# Patient Record
Sex: Male | Born: 1985 | Race: Black or African American | Hispanic: No | Marital: Single | Smoking: Never smoker
Health system: Southern US, Community
[De-identification: ages and names within clinical notes are randomized; demographics above are authoritative.]

---

## 2019-06-01 ENCOUNTER — Other Ambulatory Visit: Payer: Self-pay

## 2019-06-01 ENCOUNTER — Ambulatory Visit (HOSPITAL_COMMUNITY)
Admission: AD | Admit: 2019-06-01 | Discharge: 2019-06-01 | Disposition: A | Discharge status: Home / Self Care | Payer: Medicaid - Out of State | Source: Other Acute Inpatient Hospital | Attending: Emergency Medicine | Admitting: Emergency Medicine

## 2019-06-01 ENCOUNTER — Emergency Department: Payer: Self-pay

## 2019-06-01 ENCOUNTER — Emergency Department
Admission: EM | Admit: 2019-06-01 | Discharge: 2019-06-01 | Disposition: A | Discharge status: Other Acute Inpatient Hospital | Payer: Self-pay | Attending: Emergency Medicine | Admitting: Emergency Medicine

## 2019-06-01 DIAGNOSIS — I7103 Dissection of thoracoabdominal aorta: Secondary | ICD-10-CM | POA: Insufficient documentation

## 2019-06-01 DIAGNOSIS — Z20822 Contact with and (suspected) exposure to covid-19: Secondary | ICD-10-CM | POA: Insufficient documentation

## 2019-06-01 DIAGNOSIS — I10 Essential (primary) hypertension: Secondary | ICD-10-CM | POA: Insufficient documentation

## 2019-06-01 DIAGNOSIS — I712 Thoracic aortic aneurysm, without rupture: Secondary | ICD-10-CM | POA: Insufficient documentation

## 2019-06-01 DIAGNOSIS — R079 Chest pain, unspecified: Secondary | ICD-10-CM

## 2019-06-01 LAB — COMPREHENSIVE METABOLIC PANEL
ALT: 23 U/L (ref 0–44)
AST: 28 U/L (ref 15–41)
Albumin: 4 g/dL (ref 3.5–5.0)
Alkaline Phosphatase: 73 U/L (ref 38–126)
Anion gap: 11 (ref 5–15)
BUN: 21 mg/dL — ABNORMAL HIGH (ref 6–20)
CO2: 26 mmol/L (ref 22–32)
Calcium: 9 mg/dL (ref 8.9–10.3)
Chloride: 102 mmol/L (ref 98–111)
Creatinine, Ser: 1.22 mg/dL (ref 0.61–1.24)
GFR calc Af Amer: >60 mL/min (ref >60)
GFR calc non Af Amer: >60 mL/min (ref >60)
Glucose, Bld: 130 mg/dL — ABNORMAL HIGH (ref 70–99)
Potassium: 3.5 mmol/L (ref 3.5–5.1)
Sodium: 139 mmol/L (ref 135–145)
Total Bilirubin: 0.8 mg/dL (ref 0.3–1.2)
Total Protein: 7.7 g/dL (ref 6.5–8.1)

## 2019-06-01 LAB — RESPIRATORY PANEL BY RT PCR (FLU A&B, COVID)
Influenza A by PCR: NEGATIVE
Influenza B by PCR: NEGATIVE
SARS Coronavirus 2 by RT PCR: NEGATIVE

## 2019-06-01 LAB — CBC
HCT: 39.1 % (ref 39.0–52.0)
Hemoglobin: 13.2 g/dL (ref 13.0–17.0)
MCH: 28.5 pg (ref 26.0–34.0)
MCHC: 33.8 g/dL (ref 30.0–36.0)
MCV: 84.4 fL (ref 80.0–100.0)
Platelets: 203 10*3/uL (ref 150–400)
RBC: 4.63 MIL/uL (ref 4.22–5.81)
RDW: 13.2 % (ref 11.5–15.5)
WBC: 10 10*3/uL (ref 4.0–10.5)
nRBC: 0 % (ref 0.0–0.2)

## 2019-06-01 LAB — TROPONIN I (HIGH SENSITIVITY)
Troponin I (High Sensitivity): 20 ng/L — ABNORMAL HIGH (ref <18)
Troponin I (High Sensitivity): 23 ng/L — ABNORMAL HIGH (ref <18)

## 2019-06-01 LAB — POC SARS CORONAVIRUS 2 AG: SARS Coronavirus 2 Ag: NEGATIVE

## 2019-06-01 IMAGING — CT CT ANGIO CHEST-ABD-PELV FOR DISSECTION W/ AND WO/W CM
2 of 7 series · 12 of 46 positions shown, 14 images · IV contrast (APPLIED)
Comparison: Chest radiograph [DATE]

CLINICAL DATA: [DATE] chest pressure with shortness of breath

EXAM:
CT ANGIOGRAPHY CHEST, ABDOMEN AND PELVIS
TECHNIQUE: Multidetector CT imaging through the chest, abdomen and pelvis was
performed using the standard protocol during bolus administration of
intravenous contrast. Multiplanar reconstructed images and MIPs were
obtained and reviewed to evaluate the vascular anatomy.
CONTRAST:  100mL OMNIPAQUE IOHEXOL 350 MG/ML SOLN

[Series 6: axial arterial · axial · arterial · 0.80mm/px · z∈[-1111,-541]mm · 9 of 220 slices shown, 11 images]
[im 15/220  soft-tissue]
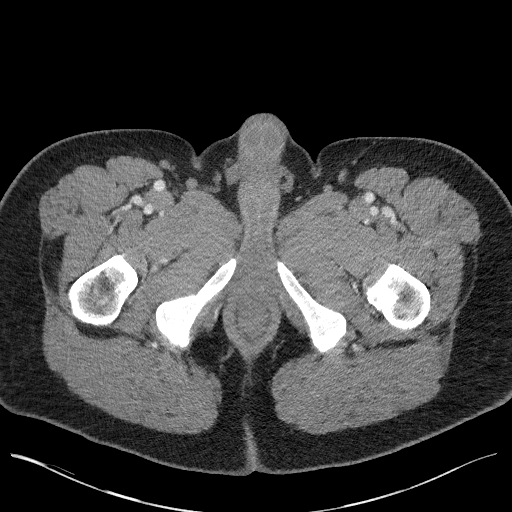
[im 15/220  bone]
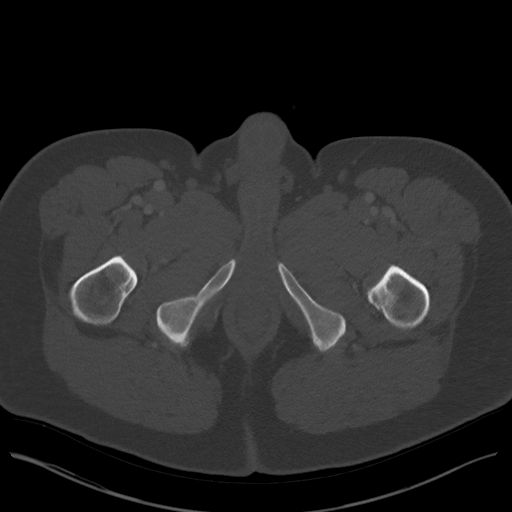
[im 44/220  soft-tissue]
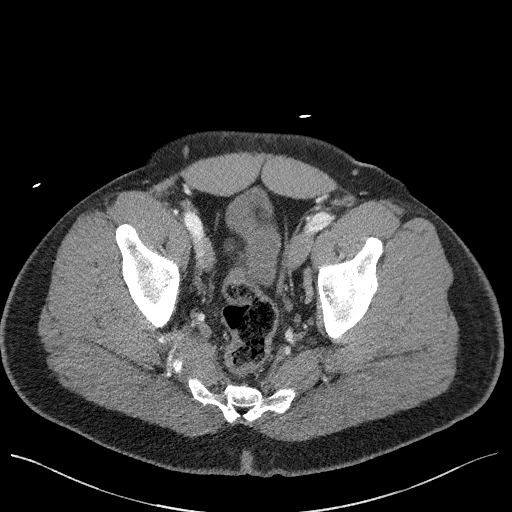
[im 59/220  soft-tissue]
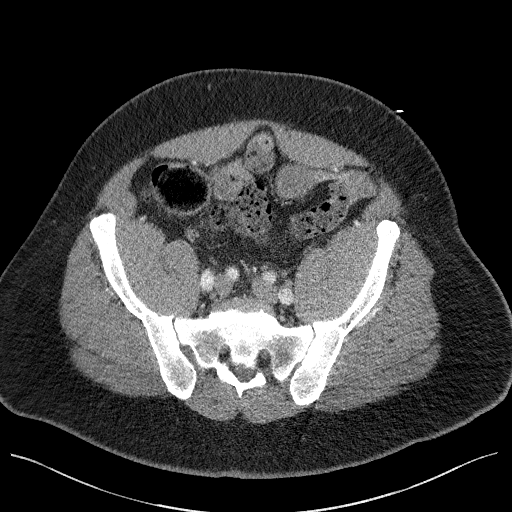
[im 88/220  soft-tissue]
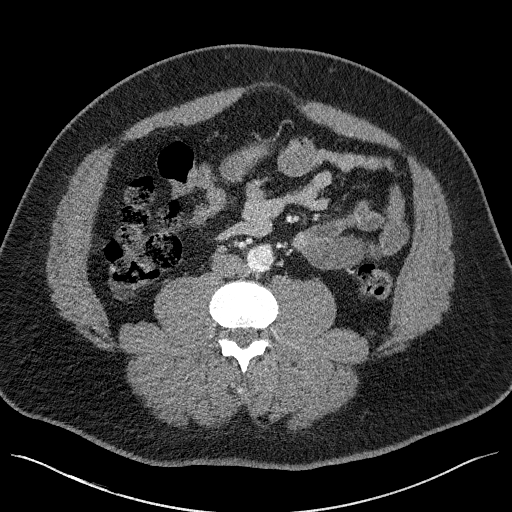
[im 117/220  soft-tissue]
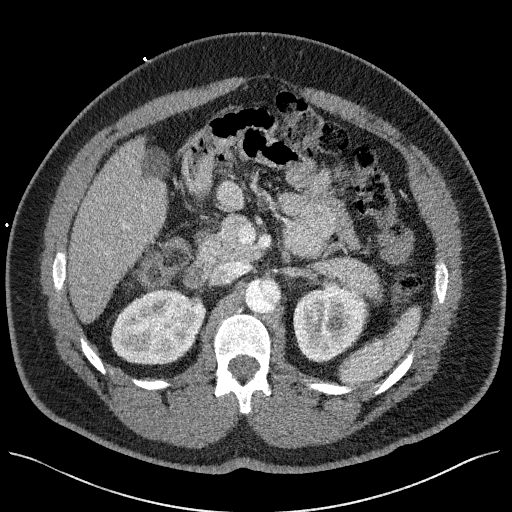
[im 132/220  soft-tissue]
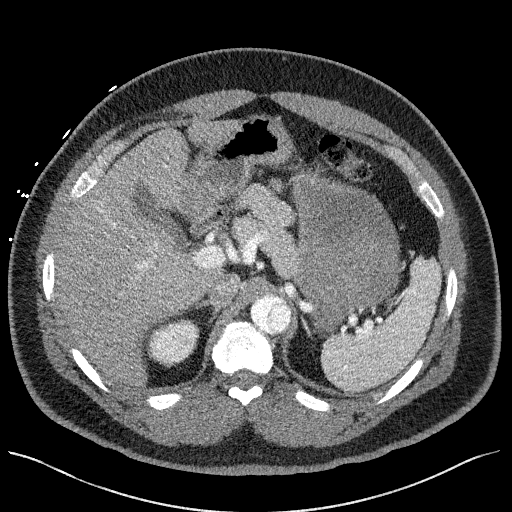
[im 161/220  soft-tissue]
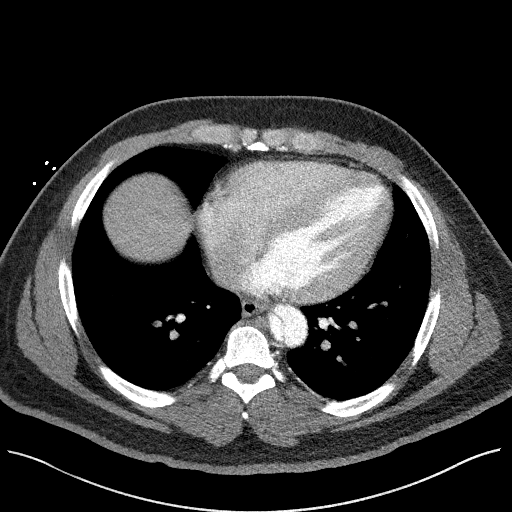
[im 176/220  soft-tissue]
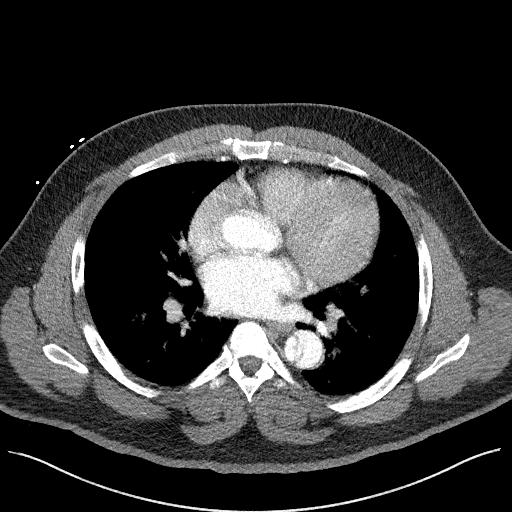
[im 205/220  soft-tissue]
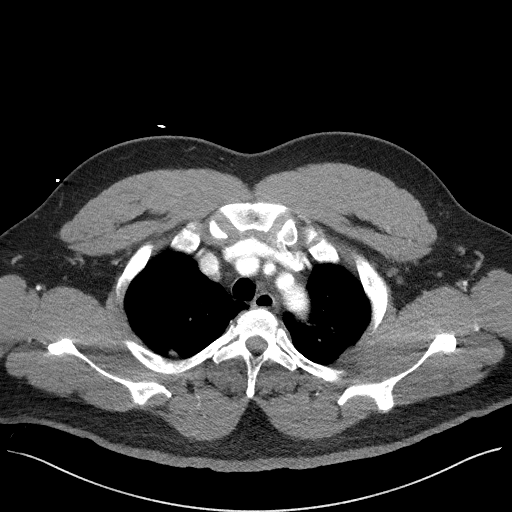
[im 205/220  bone]
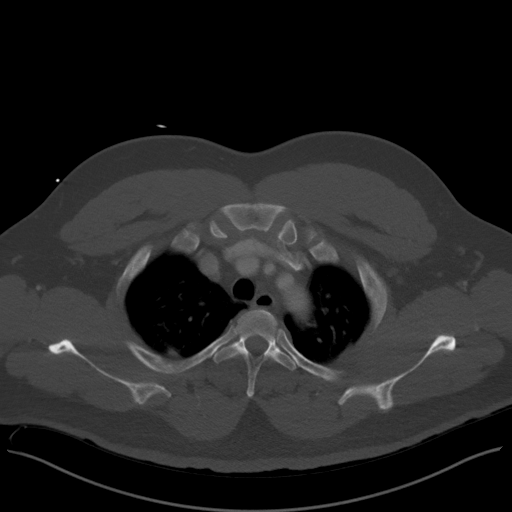

[Series 8: coronals · coronal · 0.87mm/px · 3 of 175 slices shown]
[im 44/175  soft-tissue]
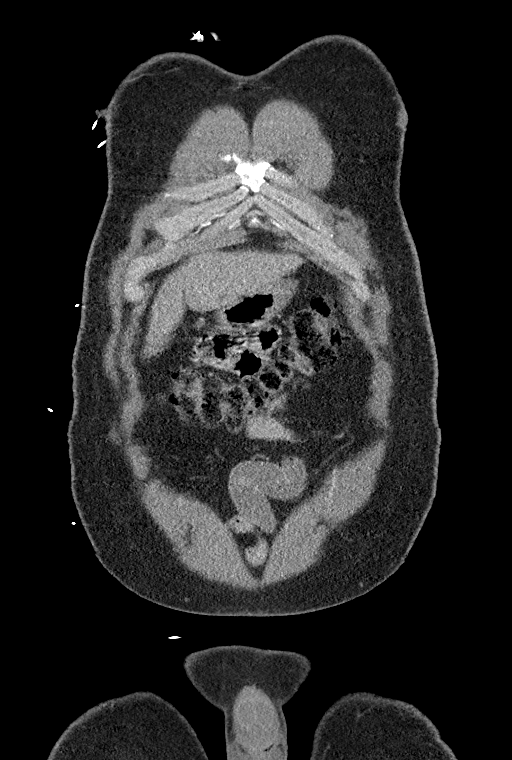
[im 88/175  soft-tissue]
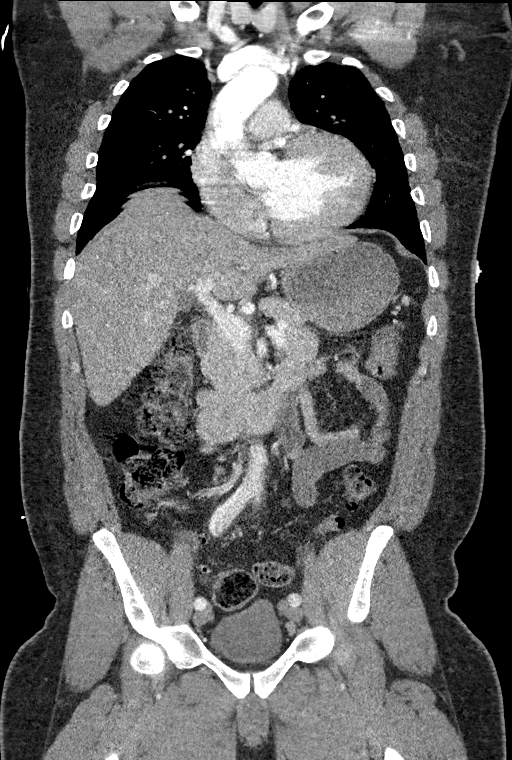
[im 131/175  soft-tissue]
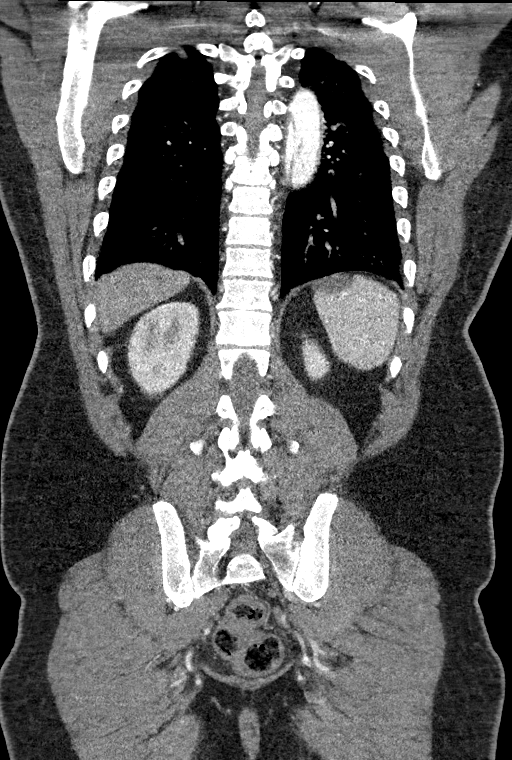

[12 of 46 positions shown; findings below may reference images not displayed]

FINDINGS: CTA CHEST FINDINGS

Cardiovascular: While there is no hyperdense mural thickening seen
on the noncontrast CT of the chest following satisfactory
opacification of the thoracic aorta on arterial phase imaging a long
segment dissection flap is seen originating just distal to the
origin of the left subclavian artery and extending throughout the
length of the descending thoracic aorta into the abdomen and beyond
the aortic bifurcation extending into the right external iliac
artery and into the left common femoral artery terminating just
before the bifurcation of the profundus femora. There is a larger
false luminal diameter with the smaller true lumen. Question a
possible intimal tear at the level of the distal aortic arch on
coronal image (8/112) though this may be artifactual given pulse a
shin artifact of the aorta at this level. Mild hazy periaortic
stranding is noted.

Normal heart size. No pericardial effusion. Central pulmonary
arteries are normal caliber. Luminal evaluation is limited given
suboptimal contrast bolus timing.

Mediastinum/Nodes: No enlarged mediastinal, hilar, or axillary lymph
nodes. Fatty stippling in the anterior mediastinum likely reflects a
thymic remnant. Thyroid gland thoracic inlet are unremarkable. No
acute abnormality of the trachea or esophagus.

Lungs/Pleura: No consolidation, features of edema, pneumothorax, or
effusion. No suspicious pulmonary nodules or masses.

Musculoskeletal: No acute osseous abnormality or suspicious osseous
lesion. Straightening of the normal thoracic kyphosis. Limbus
vertebrae and Schmorl's node noted at T12.

Review of the MIP images confirms the above findings.

CTA ABDOMEN AND PELVIS FINDINGS

VASCULAR

Aorta: Extension of the thoracic aortic dissection throughout the
length of the abdominal aorta beyond the level of the bifurcation.

Celiac: Arises from the true lumen. Patent without evidence of
aneurysm, dissection, vasculitis or significant stenosis.

SMA: SMA origin arises from the true lumen. No dissection,
vasculitis or significant stenosis.

Renals: Single right and paired left renal arteries. Renal artery
origins arise from the true lumen without evident dissection
propagation into vessels. No evidence of aneurysm or vasculitis. No
features of fibromuscular dysplasia.

IMA: Arises anteriorly from the true lumen. No evidence of aneurysm,
dissection or vasculitis.

Inflow: Extension of the aortic dissection beyond the level of the
bifurcation. The right dissection flap extends to the level the
proximal right external iliac artery with the internal iliac artery
arising from true lumen. Dissection flap extends throughout the left
common iliac artery into both the internal and external iliac
arteries. Internal iliac propagation extends to the bifurcation of
the anterior and posterior divisions. Given the vessel caliber,
difficult to discern the luminal origin of these branching vessels.
Propagation into the left external iliac artery extends to the
bifurcation of the superficial femoral and femoral profundus both
vessels arising from the true lumen.

Veins: No obvious venous abnormality within the limitations of this
arterial phase study.

Review of the MIP images confirms the above findings.

NON-VASCULAR

Hepatobiliary: No focal liver abnormality is seen. No gallstones,
gallbladder wall thickening, or biliary dilatation.

Pancreas: Unremarkable. No pancreatic ductal dilatation or
surrounding inflammatory changes.

Spleen: Normal in size without focal abnormality. Small splenules
anteriorly.

Adrenals/Urinary Tract: Adrenal glands are unremarkable. Kidneys are
normal, without renal calculi, focal lesion, or hydronephrosis.
Bladder is unremarkable.

Stomach/Bowel: Distal esophagus, stomach and duodenal sweep are
unremarkable. No small bowel wall thickening or dilatation. No
evidence of obstruction. A normal appendix is visualized. No colonic
dilatation or wall thickening.

Lymphatic: No suspicious or enlarged lymph nodes in the included
lymphatic chains.

Reproductive: The prostate and seminal vesicles are unremarkable.

Other: No abdominopelvic free air or fluid. No bowel containing
hernias.

Musculoskeletal: No acute osseous abnormality or suspicious osseous
lesion.

Review of the MIP images confirms the above findings.
IMPRESSION: 1. Acute aortic syndrome with aortic dissection originating just
distal to the origin of the left subclavian artery (type B
dissection) and extending from the descending thoracic aorta, into
the abdominal aorta and beyond the aortic bifurcation. Dissection
flap extends into the external iliac artery on the right. Extension
into both the left internal iliac artery to the bifurcation of the
anterior and posterior divisions and external iliac artery to the
common femoral artery terminating just before the bifurcation of the
profundus femora.
2. There is a diminutive true lumen with a larger false luminal sac.
Possible intimal defect versus motion artifact at the level of the
distal aortic arch.
3. Celiac, SMA, IMA and renal arteries appear to arise from the true
lumen.
4. Difficult to discern the luminal origin of the anterior and
posterior divisions of the left internal iliac given contrast bolus
timing and resolution.
5. No other acute abnormalities in the chest, abdomen or pelvis.

Critical Value/emergent results were called by telephone immediately
upon discovery on [DATE] at [DATE] to provider YANNETH ,
who verbally acknowledged these results.

## 2019-06-01 MED ORDER — HYDRALAZINE HCL 20 MG/ML IJ SOLN
10.0000 mg | Freq: Once | INTRAMUSCULAR | Status: AC
  Administered 2019-06-01: 22:00:00 10 mg via INTRAVENOUS
  Filled 2019-06-01: qty 1

## 2019-06-01 MED ORDER — NICARDIPINE HCL IN NACL 20-0.86 MG/200ML-% IV SOLN
INTRAVENOUS | Status: AC
  Filled 2019-06-01: qty 200

## 2019-06-01 MED ORDER — HYDRALAZINE HCL 20 MG/ML IJ SOLN
10.0000 mg | Freq: Once | INTRAMUSCULAR | Status: AC
  Administered 2019-06-01: 21:00:00 10 mg via INTRAVENOUS
  Filled 2019-06-01: qty 1

## 2019-06-01 MED ORDER — MORPHINE SULFATE (PF) 4 MG/ML IV SOLN
4.0000 mg | Freq: Once | INTRAVENOUS | Status: AC
  Administered 2019-06-01: 23:00:00 4 mg via INTRAVENOUS
  Filled 2019-06-01: qty 1

## 2019-06-01 MED ORDER — ESMOLOL HCL-SODIUM CHLORIDE 2000 MG/100ML IV SOLN
25.0000 ug/kg/min | INTRAVENOUS | Status: DC
  Administered 2019-06-01: 22:00:00 25 ug/kg/min via INTRAVENOUS
  Filled 2019-06-01 (×2): qty 100

## 2019-06-01 MED ORDER — NICARDIPINE HCL IN NACL 20-0.86 MG/200ML-% IV SOLN: 3.0000 mg/h | INTRAVENOUS | Status: DC

## 2019-06-01 MED ORDER — IOHEXOL 350 MG/ML SOLN
100.0000 mL | Freq: Once | INTRAVENOUS | Status: AC | PRN
  Administered 2019-06-01: 21:00:00 100 mL via INTRAVENOUS

## 2019-06-01 MED ORDER — LABETALOL HCL 5 MG/ML IV SOLN
10.0000 mg | Freq: Once | INTRAVENOUS | Status: AC
  Administered 2019-06-01: 23:00:00 10 mg via INTRAVENOUS
  Filled 2019-06-01: qty 4

## 2019-06-01 MED ORDER — MORPHINE SULFATE (PF) 4 MG/ML IV SOLN
4.0000 mg | Freq: Once | INTRAVENOUS | Status: AC
  Administered 2019-06-01: 21:00:00 4 mg via INTRAVENOUS
  Filled 2019-06-01: qty 1

## 2019-06-01 MED ORDER — NITROGLYCERIN 2 % TD OINT
1.0000 [in_us] | TOPICAL_OINTMENT | Freq: Once | TRANSDERMAL | Status: AC
  Administered 2019-06-01: 1 [in_us] via TOPICAL
  Filled 2019-06-01: qty 1

## 2019-06-01 MED ORDER — NITROGLYCERIN 0.4 MG SL SUBL
0.4000 mg | SUBLINGUAL_TABLET | SUBLINGUAL | Status: DC | PRN
  Administered 2019-06-01: 0.4 mg via SUBLINGUAL

## 2019-06-01 NOTE — ED Notes (Signed)
Report given to [facility]. 

## 2019-06-01 NOTE — ED Provider Notes (Signed)
[facility] Emergency Department Provider Note  ____________________________________________   None    (approximate)  I have reviewed the triage vital signs and the nursing notes.   HISTORY  Chief Complaint Chest Pain   HPI Bobby Short is a 34 y.o. male presents to the emergency department for treatment  of chest pain.  He was in the car and began to have 10 out of 10 chest pressure.  EMS was called and they gave him 324 mg of aspirin and 1 sublingual nitro.  Patient states that this did improve his pain.  Pain now 7 out of 10.  He is also short of breath and feels nauseated.  He is notably diaphoretic.  History reviewed. No pertinent past medical history.  There are no problems to display for this patient.   History reviewed. No pertinent surgical history.  Prior to Admission medications   Not on File    Allergies Patient has no known allergies.  History reviewed. No pertinent family history.  Social History Social History   Tobacco Use  . Smoking status: Never Smoker  . Smokeless tobacco: Never Used  Substance Use Topics  . Alcohol use: Not Currently  . Drug use: Never    Review of Systems  Constitutional: No fever/chills. Eyes: No visual changes. ENT: No sore throat. Cardiovascular: Positive for chest pain and pressure.  Negative for pleuritic pain.  Negative for palpitations.  Negative for leg pain. Respiratory: Positive shortness of breath. Gastrointestinal: Negative for abdominal pain.  Positive for nausea, no vomiting.  No diarrhea.  No constipation. Genitourinary: Negative for dysuria. Musculoskeletal: Negative for back pain.  Skin: Negative for rash, lesion, wound. Neurological: Negative for headaches, focal weakness or numbness. ____________________________________________   PHYSICAL EXAM:  VITAL SIGNS: ED Triage Vitals  Enc Vitals Group     BP 06/01/19 1910 (!) 175/88     Pulse Rate 06/01/19 1910 (!) 57     Resp  06/01/19 1910 20     Temp 06/01/19 1910 98.6 F (37 C)     Temp src --      SpO2 06/01/19 1910 98 %     Weight 06/01/19 1911 270 lb (122.5 kg)     Height 06/01/19 1911 5\' 11"  (1.803 m)     Head Circumference --      Peak Flow --      Pain Score 06/01/19 1911 7     Pain Loc --      Pain Edu? --      Excl. in GC? --     Constitutional: Alert and oriented.  Acutely ill appearing and in mild acute distress.  Normal mental status. Eyes: Conjunctivae are normal. PERRL. Head: Atraumatic. Nose: No congestion/rhinnorhea. Mouth/Throat: Mucous membranes are moist.  Oropharynx non-erythematous. Tongue normal in size and color. Neck: No stridor.  No carotid bruit appreciated on exam. Hematological/Lymphatic/Immunilogical: No cervical lymphadenopathy. Cardiovascular: Normal rate, regular rhythm. Grossly normal heart sounds.  Good peripheral circulation. Respiratory: Normal respiratory effort.  No retractions. Lungs CTAB. Gastrointestinal: Soft and nontender. No distention. No abdominal bruits. No CVA tenderness. Genitourinary: Exam deferred. Musculoskeletal: No lower extremity tenderness.  No edema of extremities. Neurologic:  Normal speech and language. No gross focal neurologic deficits are appreciated. Skin:  Skin is warm, dry and intact. No rash noted. Psychiatric: Mood and affect are normal. Speech and behavior are normal.  ____________________________________________   LABS (all labs ordered are listed, but only abnormal results are displayed)  Labs Reviewed  COMPREHENSIVE METABOLIC  PANEL - Abnormal; Notable for the following components:      Result Value   Glucose, Bld 130 (*)    BUN 21 (*)    All other components within normal limits  TROPONIN I (HIGH SENSITIVITY) - Abnormal; Notable for the following components:   Troponin I (High Sensitivity) 20 (*)    All other components within normal limits  CBC  POC SARS CORONAVIRUS 2 AG -  ED  POC SARS CORONAVIRUS 2 AG  TROPONIN I  (HIGH SENSITIVITY)   ____________________________________________  EKG  ED ECG REPORT I, Demetry Bendickson, FNP-BC personally viewed and interpreted this ECG.   Date: 06/01/2019  EKG Time: 1923  Rate: 58  Rhythm: sinus bradycardia, ST depression in lateral  Axis: Normal  Intervals:none  ST&T Change: No previous for comparison  ____________________________________________  RADIOLOGY  ED MD interpretation: Chest x-ray is negative for acute cardiopulmonary abnormality.  I, Kem Boroughs, personally viewed and evaluated these images (plain radiographs) as part of my medical decision making, as well as reviewing the written report by the radiologist.  Official radiology report(s): DG Chest 2 View  Result Date: 06/01/2019 CLINICAL DATA:  Pain EXAM: CHEST - 2 VIEW COMPARISON:  None. FINDINGS: The heart size and mediastinal contours are within normal limits. Both lungs are clear. The visualized skeletal structures are unremarkable. IMPRESSION: No active cardiopulmonary disease. Electronically Signed   By: Alcide Clever M.D.   On: 06/01/2019 19:56    ____________________________________________   PROCEDURES  Procedure(s) performed: None  Procedures  Critical Care performed: Yes, see critical care note(s)  ____________________________________________   INITIAL IMPRESSION / ASSESSMENT AND PLAN / ED COURSE  34 year old male presenting to the emergency department for treatment and evaluation of sudden onset chest pain. See HPI for further details. Plan will be to obtain labs, chest xray, give nitroglycerin and monitor closely.   ----------------------------------------- 7:45 PM on 06/01/2019 -----------------------------------------  Chest pain pressure is now 5 out of 10 after nitroglycerin.  Nitroglycerin paste was ordered.  ----------------------------------------- 8:53 PM on 06/01/2019 -----------------------------------------  Patient now complaining of severe abdominal  pain.  CTA to rule out dissection ordered.  He is also noted to be hypertensive.  Hydralazine requested.  Morphine also ordered.  Nursing staff made aware of the new orders.  Dr. Silverio Lay made aware of patient's status and new complaint of abdominal pain.  Care will be transitioned to him during change of shift.    ____________________________________________  Differential diagnosis includes, but not limited to:   STEMI, non-STEMI, atypical chest pain, COVID-19, dissection   FINAL CLINICAL IMPRESSION(S) / ED DIAGNOSES  Final diagnoses:  Hypertension, unspecified type  Chest pain, unspecified type     ED Discharge Orders    None       Alexys Gassett was evaluated in Emergency Department on 06/01/2019 for the symptoms described in the history of present illness. He was evaluated in the context of the global COVID-19 pandemic, which necessitated consideration that the patient might be at risk for infection with the SARS-CoV-2 virus that causes COVID-19. Institutional protocols and algorithms that pertain to the evaluation of patients at risk for COVID-19 are in a state of rapid change based on information released by regulatory bodies including the CDC and federal and state organizations. These policies and algorithms were followed during the patient's care in the ED.  CRITICAL CARE Performed by: Kem Boroughs   Total critical care time: 15 minutes  Critical care time was exclusive of separately billable procedures and treating other patients.  Critical care was necessary to treat or prevent imminent or life-threatening deterioration.  Critical care was time spent personally by me on the following activities: development of treatment plan with patient and/or surrogate as well as nursing, discussions with consultants, evaluation of patient's response to treatment, examination of patient, obtaining history from patient or surrogate, ordering and performing treatments and interventions, ordering  and review of laboratory studies, ordering and review of radiographic studies, pulse oximetry and re-evaluation of patient's condition.   Note:  This document was prepared using Dragon voice recognition software and may include unintentional dictation errors.   Victorino Dike, FNP 06/01/19 2059    Drenda Freeze, MD 06/01/19 [phone]

## 2019-06-01 NOTE — ED Notes (Signed)
Sig other, Angel, holding cell phone, reports will take pt's shoes, and given driving directions and room number for [facility]. Wife updated by ED MD on condition and plan of care, wife verbalizes understanding.

## 2019-06-01 NOTE — ED Triage Notes (Signed)
Pt bib EMS for chest pressure. Pt was in car with wife and began to have 10/10 chest pressure. Pt received 324 asa and 1 SL nito en route. Endorses 7/10 chest pressure on arrival with SOB. Denies n/v.

## 2019-06-01 NOTE — ED Notes (Signed)
Mother Aram Beecham updated with pt's permission at this time. Number is [phone]

## 2019-06-02 ENCOUNTER — Encounter: Payer: Self-pay | Admitting: Emergency Medicine
